# Patient Record
Sex: Male | Born: 1990 | Race: Black or African American | Hispanic: No | Marital: Single | Smoking: Current some day smoker
Health system: Southern US, Community
[De-identification: ages and names within clinical notes are randomized; demographics above are authoritative.]

## PROBLEM LIST (undated history)

## (undated) DIAGNOSIS — J45909 Unspecified asthma, uncomplicated: Secondary | ICD-10-CM

---

## 2014-09-20 ENCOUNTER — Encounter (HOSPITAL_COMMUNITY): Payer: Self-pay | Admitting: Emergency Medicine

## 2014-09-20 ENCOUNTER — Emergency Department (HOSPITAL_COMMUNITY)
Admission: EM | Admit: 2014-09-20 | Discharge: 2014-09-20 | Disposition: A | Discharge status: Home / Self Care | Payer: Self-pay | Attending: Emergency Medicine | Admitting: Emergency Medicine

## 2014-09-20 DIAGNOSIS — R0789 Other chest pain: Secondary | ICD-10-CM | POA: Insufficient documentation

## 2014-09-20 DIAGNOSIS — Z720 Tobacco use: Secondary | ICD-10-CM | POA: Insufficient documentation

## 2014-09-20 DIAGNOSIS — Z7952 Long term (current) use of systemic steroids: Secondary | ICD-10-CM | POA: Insufficient documentation

## 2014-09-20 DIAGNOSIS — J45901 Unspecified asthma with (acute) exacerbation: Secondary | ICD-10-CM | POA: Insufficient documentation

## 2014-09-20 DIAGNOSIS — Z79899 Other long term (current) drug therapy: Secondary | ICD-10-CM | POA: Insufficient documentation

## 2014-09-20 HISTORY — DX: Unspecified asthma, uncomplicated: J45.909

## 2014-09-20 MED ORDER — ALBUTEROL SULFATE (2.5 MG/3ML) 0.083% IN NEBU
5.0000 mg | INHALATION_SOLUTION | Freq: Once | RESPIRATORY_TRACT | Status: AC
  Administered 2014-09-20: 5 mg via RESPIRATORY_TRACT
  Filled 2014-09-20: qty 6

## 2014-09-20 MED ORDER — PREDNISONE 20 MG PO TABS: 40.0000 mg | ORAL_TABLET | Freq: Every day | ORAL | Status: DC

## 2014-09-20 MED ORDER — ALBUTEROL SULFATE HFA 108 (90 BASE) MCG/ACT IN AERS
2.0000 | INHALATION_SPRAY | Freq: Once | RESPIRATORY_TRACT | Status: AC
  Administered 2014-09-20: 2 via RESPIRATORY_TRACT
  Filled 2014-09-20: qty 6.7

## 2014-09-20 MED ORDER — PREDNISONE 20 MG PO TABS
60.0000 mg | ORAL_TABLET | Freq: Once | ORAL | Status: AC
  Administered 2014-09-20: 60 mg via ORAL
  Filled 2014-09-20: qty 3

## 2014-09-20 NOTE — ED Notes (Addendum)
Patient O2 sats remain 100% on room air while ambulating

## 2014-09-20 NOTE — ED Notes (Signed)
Pt. reports asthma attack with dry cough onsett his morning , denies fever or chills.

## 2014-09-20 NOTE — Discharge Instructions (Signed)

## 2014-09-20 NOTE — ED Provider Notes (Signed)
CSN: [id number]     Arrival date & time 09/20/14  [id number] History   First MD Initiated Contact with Patient 09/20/14 [phone]     Chief Complaint  Patient presents with  . Asthma     (Consider location/radiation/quality/duration/timing/severity/associated sxs/prior Treatment) HPI Comments: 23 year old male with a history of asthma presents to the emergency department for further evaluation of shortness of breath. He states that he was joking with a friend and started to laugh uncontrollably which triggered an asthma attack. He states that he has not had problems with his asthma for the last 2-3 months. He states he is out of his albuterol inhaler and was unable to take any medication for symptoms prior to arrival. He states that his symptoms have improved since receiving a nebulizer treatment in the ED. He states that he experienced some associated wheezing and chest tightness. No associated fever, cough, vomiting, or syncope.  Patient is a 23 y.o. male presenting with asthma. The history is provided by the patient. No language interpreter was used.  Asthma Pertinent negatives include no coughing, fever or vomiting.    Past Medical History  Diagnosis Date  . Asthma    History reviewed. No pertinent past surgical history. No family history on file. History  Substance Use Topics  . Smoking status: Current Every Day Smoker  . Smokeless tobacco: Not on file  . Alcohol Use: No    Review of Systems  Constitutional: Negative for fever.  Respiratory: Positive for chest tightness, shortness of breath and wheezing. Negative for cough.   Gastrointestinal: Negative for vomiting.  Neurological: Negative for syncope.  All other systems reviewed and are negative.   Allergies  Review of patient's allergies indicates no known allergies.  Home Medications   Prior to Admission medications   Medication Sig Start Date End Date Taking? Authorizing Provider  albuterol (PROVENTIL HFA;VENTOLIN HFA) 108  (90 BASE) MCG/ACT inhaler Inhale 2 puffs into the lungs every 6 (six) hours as needed for wheezing or shortness of breath.   Yes Historical Provider, MD  predniSONE (DELTASONE) 20 MG tablet Take 2 tablets (40 mg total) by mouth daily. Begin on 09/21/14 09/20/14   Antony MaduraKelly Mistie Adney, PA-C   BP 116/67 mmHg  Pulse 61  Temp(Src) 97.6 F (36.4 C) (Oral)  Resp 16  Ht 6\' 1"  (1.854 m)  Wt 195 lb (88.451 kg)  BMI 25.73 kg/m2  SpO2 100%   Physical Exam  Constitutional: He is oriented to person, place, and time. He appears well-developed and well-nourished. No distress.  Nontoxic/nonseptic appearing  HENT:  Head: Normocephalic and atraumatic.  Eyes: Conjunctivae and EOM are normal. No scleral icterus.  Neck: Normal range of motion.  Cardiovascular: Normal rate, regular rhythm and normal heart sounds.   Pulmonary/Chest: Effort normal and breath sounds normal. No respiratory distress. He has no wheezes. He has no rales.  Respirations even and unlabored. No retractions or accessory muscle use  Musculoskeletal: Normal range of motion.  Neurological: He is alert and oriented to person, place, and time. He exhibits normal muscle tone. Coordination normal.  GCS 15. Patient speaking in full goal oriented sentences.  Skin: Skin is warm and dry. No rash noted. He is not diaphoretic. No erythema. No pallor.  Psychiatric: He has a normal mood and affect. His behavior is normal.  Nursing note and vitals reviewed.   ED Course  Procedures (including critical care time) Labs Review Labs Reviewed - No data to display  Imaging Review No results found.   EKG Interpretation  None      MDM   Final diagnoses:  Asthma exacerbation    Patient ambulated in ED with O2 saturations at 100%, no current signs of respiratory distress. Lungs CTAB on exam, after patient received DuoNob. Prednisone given in the ED and pt will be discharged with 5 day burst. Pt states they are breathing at baseline. Pt has been instructed  to continue using prescribed medications and to speak with PCP about today's exacerbation. Return precautions provided and patient agreeable to plan with no unaddressed concerns.   Filed Vitals:   09/20/14 0500 09/20/14 0512 09/20/14 0515 09/20/14 0530  BP: 123/71 123/71 122/68 116/67  Pulse: 64 68 62 61  Temp:      TempSrc:      Resp:  16    Height:      Weight:      SpO2: 100% 100% 100% 100%        Antony MaduraKelly Omer Puccinelli, PA-C 09/20/14 0556  Dione Boozeavid Glick, MD 09/20/14 [phone]

## 2015-04-04 ENCOUNTER — Encounter (HOSPITAL_COMMUNITY): Payer: Self-pay

## 2015-04-04 ENCOUNTER — Emergency Department (HOSPITAL_COMMUNITY)
Admission: EM | Admit: 2015-04-04 | Discharge: 2015-04-04 | Disposition: A | Discharge status: Home / Self Care | Payer: Self-pay | Attending: Emergency Medicine | Admitting: Emergency Medicine

## 2015-04-04 DIAGNOSIS — Y9389 Activity, other specified: Secondary | ICD-10-CM | POA: Insufficient documentation

## 2015-04-04 DIAGNOSIS — Z79899 Other long term (current) drug therapy: Secondary | ICD-10-CM | POA: Insufficient documentation

## 2015-04-04 DIAGNOSIS — S80862A Insect bite (nonvenomous), left lower leg, initial encounter: Secondary | ICD-10-CM | POA: Insufficient documentation

## 2015-04-04 DIAGNOSIS — J45909 Unspecified asthma, uncomplicated: Secondary | ICD-10-CM | POA: Insufficient documentation

## 2015-04-04 DIAGNOSIS — Z720 Tobacco use: Secondary | ICD-10-CM | POA: Insufficient documentation

## 2015-04-04 DIAGNOSIS — Y998 Other external cause status: Secondary | ICD-10-CM | POA: Insufficient documentation

## 2015-04-04 DIAGNOSIS — W57XXXA Bitten or stung by nonvenomous insect and other nonvenomous arthropods, initial encounter: Secondary | ICD-10-CM | POA: Insufficient documentation

## 2015-04-04 DIAGNOSIS — Y9289 Other specified places as the place of occurrence of the external cause: Secondary | ICD-10-CM | POA: Insufficient documentation

## 2015-04-04 DIAGNOSIS — L259 Unspecified contact dermatitis, unspecified cause: Secondary | ICD-10-CM | POA: Insufficient documentation

## 2015-04-04 DIAGNOSIS — Z7952 Long term (current) use of systemic steroids: Secondary | ICD-10-CM | POA: Insufficient documentation

## 2015-04-04 MED ORDER — DOXYCYCLINE HYCLATE 100 MG PO CAPS: 100.0000 mg | ORAL_CAPSULE | Freq: Two times a day (BID) | ORAL | Status: DC

## 2015-04-04 NOTE — Discharge Instructions (Signed)
Contact Dermatitis °Contact dermatitis is a reaction to certain substances that touch the skin. Contact dermatitis can be either irritant contact dermatitis or allergic contact dermatitis. Irritant contact dermatitis does not require previous exposure to the substance for a reaction to occur. Allergic contact dermatitis only occurs if you have been exposed to the substance before. Upon a repeat exposure, your body reacts to the substance.  °CAUSES  °Many substances can cause contact dermatitis. Irritant dermatitis is most commonly caused by repeated exposure to mildly irritating substances, such as: °· Makeup. °· Soaps. °· Detergents. °· Bleaches. °· Acids. °· Metal salts, such as nickel. °Allergic contact dermatitis is most commonly caused by exposure to: °· Poisonous plants. °· Chemicals (deodorants, shampoos). °· Jewelry. °· Latex. °· Neomycin in triple antibiotic cream. °· Preservatives in products, including clothing. °SYMPTOMS  °The area of skin that is exposed may develop: °1. Dryness or flaking. °2. Redness. °3. Cracks. °4. Itching. °5. Pain or a burning sensation. °6. Blisters. °With allergic contact dermatitis, there may also be swelling in areas such as the eyelids, mouth, or genitals.  °DIAGNOSIS  °Your caregiver can usually tell what the problem is by doing a physical exam. In cases where the cause is uncertain and an allergic contact dermatitis is suspected, a patch skin test may be performed to help determine the cause of your dermatitis. °TREATMENT °Treatment includes protecting the skin from further contact with the irritating substance by avoiding that substance if possible. Barrier creams, powders, and gloves may be helpful. Your caregiver may also recommend: °· Steroid creams or ointments applied 2 times daily. For best results, soak the rash area in cool water for 20 minutes. Then apply the medicine. Cover the area with a plastic wrap. You can store the steroid cream in the refrigerator for a  "chilly" effect on your rash. That may decrease itching. Oral steroid medicines may be needed in more severe cases. °· Antibiotics or antibacterial ointments if a skin infection is present. °· Antihistamine lotion or an antihistamine taken by mouth to ease itching. °· Lubricants to keep moisture in your skin. °· Burow's solution to reduce redness and soreness or to dry a weeping rash. Mix one packet or tablet of solution in 2 cups cool water. Dip a clean washcloth in the mixture, wring it out a bit, and put it on the affected area. Leave the cloth in place for 30 minutes. Do this as often as possible throughout the day. °· Taking several cornstarch or baking soda baths daily if the area is too large to cover with a washcloth. °Harsh chemicals, such as alkalis or acids, can cause skin damage that is like a burn. You should flush your skin for 15 to 20 minutes with cold water after such an exposure. You should also seek immediate medical care after exposure. Bandages (dressings), antibiotics, and pain medicine may be needed for severely irritated skin.  °HOME CARE INSTRUCTIONS °· Avoid the substance that caused your reaction. °· Keep the area of skin that is affected away from hot water, soap, sunlight, chemicals, acidic substances, or anything else that would irritate your skin. °· Do not scratch the rash. Scratching may cause the rash to become infected. °· You may take cool baths to help stop the itching. °· Only take over-the-counter or prescription medicines as directed by your caregiver. °· See your caregiver for follow-up care as directed to make sure your skin is healing properly. °SEEK MEDICAL CARE IF:  °· Your condition is not better after 3   days of treatment. °· You seem to be getting worse. °· You see signs of infection such as swelling, tenderness, redness, soreness, or warmth in the affected area. °· You have any problems related to your medicines. °Document Released: 10/04/2000 Document Revised:  12/30/2011 Document Reviewed: 03/12/2011 °Exit[employer]® Patient Information ©2015 [employer]. This information is not intended to replace advice given to you by your health care provider. Make sure you discuss any questions you have with your health care provider. ° °Tick Bite Information °Ticks are insects that attach themselves to the skin and draw blood for food. There are various types of ticks. Common types include wood ticks and deer ticks. Most ticks live in shrubs and grassy areas. Ticks can climb onto your body when you make contact with leaves or grass where the tick is waiting. The most common places on the body for ticks to attach themselves are the scalp, neck, armpits, waist, and groin. °Most tick bites are harmless, but sometimes ticks carry germs that cause diseases. These germs can be spread to a person during the tick's feeding process. The chance of a disease spreading through a tick bite depends on:  °· The type of tick. °· Time of year.   °· How long the tick is attached.   °· Geographic location.   °HOW CAN YOU PREVENT TICK BITES? °Take these steps to help prevent tick bites when you are outdoors: °· Wear protective clothing. Long sleeves and long pants are best.   °· Wear white clothes so you can see ticks more easily. °· Tuck your pant legs into your socks.   °· If walking on a trail, stay in the middle of the trail to avoid brushing against bushes. °· Avoid walking through areas with long grass.  °· Put insect repellent on all exposed skin and along boot tops, pant legs, and sleeve cuffs.   °· Check clothing, hair, and skin repeatedly and before going inside.   °· Brush off any ticks that are not attached. °· Take a shower or bath as soon as possible after being outdoors.    °WHAT IS THE PROPER WAY TO REMOVE A TICK? °Ticks should be removed as soon as possible to help prevent diseases caused by tick bites. °7. If latex gloves are available, put them on before trying to remove a tick.    °8. Using fine-point tweezers, grasp the tick as close to the skin as possible. You may also use curved forceps or a tick removal tool. Grasp the tick as close to its head as possible. Avoid grasping the tick on its body. °9. Pull gently with steady upward pressure until the tick lets go. Do not twist the tick or jerk it suddenly. This may break off the tick's head or mouth parts. °10. Do not squeeze or crush the tick's body. This could force disease-carrying fluids from the tick into your body.   °11. After the tick is removed, wash the bite area and your hands with soap and water or other disinfectant such as alcohol. °12. Apply a small amount of antiseptic cream or ointment to the bite site.   °13. Wash and disinfect any instruments that were used.   °Do not try to remove a tick by applying a hot match, petroleum jelly, or fingernail polish to the tick. These methods do not work and may increase the chances of disease being spread from the tick bite.  °WHEN SHOULD YOU SEEK MEDICAL CARE? °Contact your health care provider if you are unable to remove a tick from your skin or if a part   of the tick breaks off and is stuck in the skin.  °After a tick bite, you need to be aware of signs and symptoms that could be related to diseases spread by ticks. Contact your health care provider if you develop any of the following in the days or weeks after the tick bite: °· Unexplained fever. °· Rash. A circular rash that appears days or weeks after the tick bite may indicate the possibility of Lyme disease. The rash may resemble a target with a bull's-eye and may occur at a different part of your body than the tick bite. °· Redness and swelling in the area of the tick bite.   °· Tender, swollen lymph glands.   °· Diarrhea.   °· Weight loss.   °· Cough.   °· Fatigue.   °· Muscle, joint, or bone pain.   °· Abdominal pain.   °· Headache.   °· Lethargy or a change in your level of consciousness. °· Difficulty walking or moving your  legs.   °· Numbness in the legs.   °· Paralysis. °· Shortness of breath.   °· Confusion.   °· Repeated vomiting.   °Document Released: 10/04/2000 Document Revised: 07/28/2013 Document Reviewed: 03/17/2013 °Exit[employer]® Patient Information ©2015 [employer]. This information is not intended to replace advice given to you by your health care provider. Make sure you discuss any questions you have with your health care provider. ° °

## 2015-04-04 NOTE — ED Notes (Signed)
Pt reports tick bite 2 weeks ago on anterior lower leg, onset 5-6 days ago rash and swelling noted to that area.  Pt reports fever several days ago.  No other s/s noted.

## 2015-04-04 NOTE — ED Provider Notes (Signed)
CSN: [id number]     Arrival date & time 04/04/15  1200 History  This chart was scribed for non-physician practitioner, Teressa Lower, NP, working with Mancel Bale, MD, by Ronney Lion, ED Scribe. This patient was seen in room TR09C/TR09C and the patient's care was started at 12:22 PM.      Chief Complaint  Patient presents with  . Rash   The history is provided by the patient. No language interpreter was used.    HPI Comments: Daniel Keith is a 24 y.o. male who presents to the Emergency Department complaining of an area of gradual onset, constant, gradually worsening redness and swelling on his anterior left lower leg that began 5 days ago after being bitten by a tick bite in the same area 2 weeks ago. He states it itches "sometimes." Patient works outdoors.   Past Medical History  Diagnosis Date  . Asthma    History reviewed. No pertinent past surgical history. History reviewed. No pertinent family history. History  Substance Use Topics  . Smoking status: Current Some Day Smoker    Types: Cigarettes  . Smokeless tobacco: Not on file  . Alcohol Use: No    Review of Systems  Skin: Positive for rash.  All other systems reviewed and are negative.   Allergies  Review of patient's allergies indicates no known allergies.  Home Medications   Prior to Admission medications   Medication Sig Start Date End Date Taking? Authorizing Provider  albuterol (PROVENTIL HFA;VENTOLIN HFA) 108 (90 BASE) MCG/ACT inhaler Inhale 2 puffs into the lungs every 6 (six) hours as needed for wheezing or shortness of breath.    Historical Provider, MD  predniSONE (DELTASONE) 20 MG tablet Take 2 tablets (40 mg total) by mouth daily. Begin on 09/21/14 09/20/14   Antony Madura, PA-C   BP 126/68 mmHg  Pulse 79  Temp(Src) 98 F (36.7 C) (Oral)  Resp 20  Ht 6\' 1"  (1.854 m)  Wt 177 lb 12.8 oz (80.65 kg)  BMI 23.46 kg/m2  SpO2 98% Physical Exam  Constitutional: He is oriented to person, place, and  time. He appears well-developed and well-nourished. No distress.  HENT:  Head: Normocephalic and atraumatic.  Eyes: Conjunctivae and EOM are normal.  Neck: Neck supple. No tracheal deviation present.  Cardiovascular: Normal rate.   Pulmonary/Chest: Effort normal. No respiratory distress.  Musculoskeletal: Normal range of motion.  Neurological: He is alert and oriented to person, place, and time.  Skin:  Red vesicular rash to the left lower leg. No drainage or warmth noted  Psychiatric: He has a normal mood and affect. His behavior is normal.  Nursing note and vitals reviewed.   ED Course  Procedures (including critical care time)  DIAGNOSTIC STUDIES: Oxygen Saturation is 98% on RA, normal by my interpretation.    COORDINATION OF CARE: 12:23 PM - After examination, suspect contact dermatitis or exposure to poison ivy. However, will cover prophylactic treatment for possible exposure to tickborne diseases (doxycycline) and will Rx topical steroid ointment. Discussed this with pt, who verbalized understanding and agreed to plan.  MDM   Final diagnoses:  Tick bite  Contact dermatitis   Rash more consistent with contact dermatitis discussed use of steroid cream with pt. Will treat prophylactically with doxy based on recommendations  I personally performed the services described in this documentation, which was scribed in my presence. The recorded information has been reviewed and is accurate.     Teressa Lower, NP 04/04/15 1257  Mancel Bale, MD 04/05/15  [id number] 

## 2015-09-06 ENCOUNTER — Emergency Department (HOSPITAL_COMMUNITY)
Admission: EM | Admit: 2015-09-06 | Discharge: 2015-09-06 | Disposition: A | Discharge status: Home / Self Care | Payer: Self-pay | Attending: Emergency Medicine | Admitting: Emergency Medicine

## 2015-09-06 ENCOUNTER — Encounter (HOSPITAL_COMMUNITY): Payer: Self-pay | Admitting: Emergency Medicine

## 2015-09-06 ENCOUNTER — Emergency Department (HOSPITAL_COMMUNITY): Payer: Self-pay

## 2015-09-06 DIAGNOSIS — H9319 Tinnitus, unspecified ear: Secondary | ICD-10-CM | POA: Insufficient documentation

## 2015-09-06 DIAGNOSIS — H6691 Otitis media, unspecified, right ear: Secondary | ICD-10-CM | POA: Insufficient documentation

## 2015-09-06 DIAGNOSIS — F17210 Nicotine dependence, cigarettes, uncomplicated: Secondary | ICD-10-CM | POA: Insufficient documentation

## 2015-09-06 DIAGNOSIS — K219 Gastro-esophageal reflux disease without esophagitis: Secondary | ICD-10-CM | POA: Insufficient documentation

## 2015-09-06 DIAGNOSIS — R42 Dizziness and giddiness: Secondary | ICD-10-CM | POA: Insufficient documentation

## 2015-09-06 DIAGNOSIS — H53149 Visual discomfort, unspecified: Secondary | ICD-10-CM | POA: Insufficient documentation

## 2015-09-06 DIAGNOSIS — J4521 Mild intermittent asthma with (acute) exacerbation: Secondary | ICD-10-CM | POA: Insufficient documentation

## 2015-09-06 DIAGNOSIS — R519 Headache, unspecified: Secondary | ICD-10-CM

## 2015-09-06 DIAGNOSIS — R51 Headache: Secondary | ICD-10-CM

## 2015-09-06 DIAGNOSIS — Z79899 Other long term (current) drug therapy: Secondary | ICD-10-CM | POA: Insufficient documentation

## 2015-09-06 MED ORDER — ALBUTEROL SULFATE HFA 108 (90 BASE) MCG/ACT IN AERS
2.0000 | INHALATION_SPRAY | Freq: Once | RESPIRATORY_TRACT | Status: AC
  Administered 2015-09-06: 2 via RESPIRATORY_TRACT
  Filled 2015-09-06: qty 6.7

## 2015-09-06 MED ORDER — OMEPRAZOLE 20 MG PO CPDR: 20.0000 mg | DELAYED_RELEASE_CAPSULE | Freq: Every day | ORAL | Status: DC

## 2015-09-06 MED ORDER — PREDNISONE 20 MG PO TABS: 40.0000 mg | ORAL_TABLET | Freq: Every day | ORAL | Status: DC

## 2015-09-06 MED ORDER — AMOXICILLIN 500 MG PO CAPS
1000.0000 mg | ORAL_CAPSULE | Freq: Once | ORAL | Status: AC
  Administered 2015-09-06: 1000 mg via ORAL
  Filled 2015-09-06: qty 2

## 2015-09-06 MED ORDER — ACETAMINOPHEN 500 MG PO TABS
1000.0000 mg | ORAL_TABLET | Freq: Once | ORAL | Status: AC
  Administered 2015-09-06: 1000 mg via ORAL
  Filled 2015-09-06: qty 2

## 2015-09-06 MED ORDER — METOCLOPRAMIDE HCL 10 MG PO TABS
10.0000 mg | ORAL_TABLET | Freq: Once | ORAL | Status: AC
  Administered 2015-09-06: 10 mg via ORAL
  Filled 2015-09-06: qty 1

## 2015-09-06 MED ORDER — MECLIZINE HCL 12.5 MG PO TABS
12.5000 mg | ORAL_TABLET | Freq: Three times a day (TID) | ORAL | Status: DC | PRN
Start: 2015-09-06 — End: 2020-08-26

## 2015-09-06 MED ORDER — ALBUTEROL SULFATE (2.5 MG/3ML) 0.083% IN NEBU
5.0000 mg | INHALATION_SOLUTION | Freq: Once | RESPIRATORY_TRACT | Status: AC
  Administered 2015-09-06: 5 mg via RESPIRATORY_TRACT
  Filled 2015-09-06: qty 6

## 2015-09-06 MED ORDER — DIPHENHYDRAMINE HCL 25 MG PO CAPS
25.0000 mg | ORAL_CAPSULE | Freq: Once | ORAL | Status: AC
  Administered 2015-09-06: 25 mg via ORAL
  Filled 2015-09-06: qty 1

## 2015-09-06 MED ORDER — AMOXICILLIN 500 MG PO CAPS: 500.0000 mg | ORAL_CAPSULE | Freq: Once | ORAL | Status: DC

## 2015-09-06 MED ORDER — AMOXICILLIN 500 MG PO CAPS: 500.0000 mg | ORAL_CAPSULE | Freq: Three times a day (TID) | ORAL | Status: DC

## 2015-09-06 MED ORDER — IPRATROPIUM-ALBUTEROL 0.5-2.5 (3) MG/3ML IN SOLN
3.0000 mL | Freq: Once | RESPIRATORY_TRACT | Status: AC
  Administered 2015-09-06: 3 mL via RESPIRATORY_TRACT
  Filled 2015-09-06: qty 3

## 2015-09-06 NOTE — ED Notes (Signed)
Patient transported to X-ray 

## 2015-09-06 NOTE — ED Provider Notes (Signed)
CSN: [id number]     Arrival date & time 09/06/15  1930 History  By signing my name below, I, Daniel Keith, attest that this documentation has been prepared under the direction and in the presence of Daniel Berry, PA-C. Electronically Signed: Evon Keith, ED Scribe. 09/07/2015. 2:21 AM.      Chief Complaint  Patient presents with  . Asthma   The history is provided by the patient. No language interpreter was used.   HPI Comments: Daniel Keith is a 24 y.o. male who presents to the Emergency Department complaining of SOB onset 3 days prior. Pt states that for the past 3 nights he wakes up with SOB and wheezing. Pt also reports slight chest tightness. Pt states that he has been spitting up green sputum. Pt states that he does not currently have a rescue inhaler. Pt states that his symptoms are worse when taking in deep breaths,  Pt also reports intermittent temporal HA and dizziness for 1 month. Pt states that the HA usually last for about 1 hour. Pt reports associated photophobia and tinnitus. Pt describes the dizziness as the room as spinning. Pt states that the dizziness is worse when standing and walking around.  Denies leg swelling, fever, rhinorrhea, cough or congestion. Pt reports that he is a .5 pack per day smoker with 4 packyear hx. Pt denies any recent sick contacts. Pt denies neck pain, neck stiffness, confusion or LOC. Pt denies abdominal pain, n/v/d. He denies chest pain, orthopnea, lower extremity edema, palpitations, lightheadedness.    Past Medical History  Diagnosis Date  . Asthma    History reviewed. No pertinent past surgical history. No family history on file. Social History  Substance Use Topics  . Smoking status: Current Some Day Smoker    Types: Cigarettes  . Smokeless tobacco: None  . Alcohol Use: No    Review of Systems  Constitutional: Negative for fever.  HENT: Positive for tinnitus. Negative for congestion, ear discharge, rhinorrhea and sneezing.    Eyes: Positive for photophobia.  Respiratory: Positive for chest tightness, shortness of breath and wheezing. Negative for cough.   Cardiovascular: Negative for leg swelling.  Gastrointestinal: Negative for nausea, vomiting, abdominal pain and diarrhea.  Musculoskeletal: Negative for neck pain and neck stiffness.  Neurological: Positive for dizziness and headaches. Negative for syncope.  Psychiatric/Behavioral: Negative for confusion.      Allergies  Review of patient's allergies indicates no known allergies.  Home Medications   Prior to Admission medications   Medication Sig Start Date End Date Taking? Authorizing Provider  albuterol (PROVENTIL HFA;VENTOLIN HFA) 108 (90 BASE) MCG/ACT inhaler Inhale 2 puffs into the lungs every 6 (six) hours as needed for wheezing or shortness of breath.   Yes Historical Provider, MD  amoxicillin (AMOXIL) 500 MG capsule Take 1 capsule (500 mg total) by mouth 3 (three) times daily. 09/06/15   Daniel Berry, PA-C  doxycycline (VIBRAMYCIN) 100 MG capsule Take 1 capsule (100 mg total) by mouth 2 (two) times daily. Patient not taking: Reported on 09/06/2015 04/04/15   Teressa Lower, NP  meclizine (ANTIVERT) 12.5 MG tablet Take 1 tablet (12.5 mg total) by mouth 3 (three) times daily as needed for dizziness. 09/06/15   Daniel Berry, PA-C  omeprazole (PRILOSEC) 20 MG capsule Take 1 capsule (20 mg total) by mouth daily. 09/06/15   Daniel Berry, PA-C  predniSONE (DELTASONE) 20 MG tablet Take 2 tablets (40 mg total) by mouth daily. 09/06/15   Daniel Berry, PA-C   BP 121/68 mmHg  Pulse 56  Temp(Src) 97.5 F (36.4 C) (Oral)  Resp 18  Ht 6\' 1"  (1.854 m)  Wt 186 lb 2 oz (84.426 kg)  BMI 24.56 kg/m2  SpO2 100%   Physical Exam  Constitutional: He is oriented to person, place, and time. He appears well-developed and well-nourished. No distress.  HENT:  Head: Normocephalic and atraumatic.  Right Ear: Tympanic membrane is erythematous and bulging.  Nose: Nose  normal.  Mouth/Throat: Oropharynx is clear and moist. No oropharyngeal exudate.  Right TM erythematous and bulging, dull with loss of landmarks and cone of light.  Left TM normal  Eyes: Conjunctivae and EOM are normal. Pupils are equal, round, and reactive to light. Right eye exhibits no discharge. Left eye exhibits no discharge. No scleral icterus.  Neck: Trachea normal, normal range of motion, full passive range of motion without pain and phonation normal. Neck supple. No JVD present. No tracheal tenderness, no spinous process tenderness and no muscular tenderness present. No rigidity. No tracheal deviation, no edema, no erythema and normal range of motion present. No Brudzinski's sign and no Kernig's sign noted. No thyromegaly present.  Cardiovascular: Normal rate, regular rhythm, normal heart sounds and intact distal pulses.  Exam reveals no gallop and no friction rub.   No murmur heard. Pulses:      Radial pulses are 2+ on the right side, and 2+ on the left side.       Dorsalis pedis pulses are 2+ on the right side, and 2+ on the left side.  Symmetrical pulses palpated, radial 2+, dorsal pedis 2+. No pretibial edema.  Pulmonary/Chest: Effort normal. No accessory muscle usage or stridor. No tachypnea. No respiratory distress. He has decreased breath sounds. He has no wheezes. He has rhonchi in the right middle field and the right lower field. He has no rales. He exhibits no tenderness and no retraction.  Decreased breath sounds throughout with rhonchi in the right middle and lower lung fields. No wheeze or rales.  Poor inspiratory effort. No tachypnea, accessory muscle use, or respiratory distress. No cyanosis or clubbing.  Abdominal: Soft. Normal appearance and bowel sounds are normal. He exhibits no distension and no mass. There is no tenderness. There is no rigidity, no rebound and no guarding.  Musculoskeletal: Normal range of motion. He exhibits no edema or tenderness.  Lymphadenopathy:     He has no cervical adenopathy.  Neurological: He is alert and oriented to person, place, and time. He has normal reflexes. No cranial nerve deficit. He exhibits normal muscle tone. Coordination normal.  Speech is clear and goal oriented, follows commands Major Cranial nerves without deficit, no facial droop Normal strength in upper and lower extremities bilaterally including dorsiflexion and plantar flexion, strong and equal grip strength Sensation normal to light and sharp touch Moves extremities without ataxia, coordination intact Normal finger to nose and rapid alternating movements Neg romberg, no pronator drift Normal gait and balance   Skin: Skin is warm and dry. No rash noted. He is not diaphoretic. No erythema. No pallor.  Psychiatric: He has a normal mood and affect. His behavior is normal. Judgment and thought content normal.  Nursing note and vitals reviewed.   ED Course  Procedures (including critical care time) DIAGNOSTIC STUDIES: Oxygen Saturation is 97% on RA, normal by my interpretation.    COORDINATION OF CARE: 2:21 AM-Discussed treatment plan with pt at bedside and pt agreed to plan.     Labs Review Labs Reviewed - No data to display  Imaging  Review Dg Chest 2 View  09/06/2015  CLINICAL DATA:  SOB x 3 days with right upper lobe chest pain. Hx of asthma and pneumonia EXAM: CHEST - 2 VIEW COMPARISON:  None available FINDINGS: Lungs are clear. Heart size and mediastinal contours are within normal limits. No effusion.  No pneumothorax. Visualized skeletal structures are unremarkable. IMPRESSION: No acute cardiopulmonary disease. Electronically Signed   By: Corlis Leak M.D.   On: 09/06/2015 22:32      EKG Interpretation None      MDM   Final diagnoses:  Asthma with bronchitis, mild intermittent, with acute exacerbation  Right otitis media, recurrence not specified, unspecified chronicity, unspecified otitis media type  Nonintractable headache, unspecified  chronicity pattern, unspecified headache type  Vertigo  Gastroesophageal reflux disease, esophagitis presence not specified   24 year old male with multiple complaints, initial complaint in triage was "asthma" Patient complains of cough with PND,. Current smoker, with asthma and pneumonia history.  He also complains of a moderate headache with photophobia, working in his ears and vertigo symptoms.  Physical exam for his lungs revealed decreased breath sounds throughout with rhonchi in the right side.  He complained of inspiratory chest pain but no tenderness to palpation of the chest wall.  He had 97% oxygen saturation on room air and had no respiratory distress. Breathing treatments were ordered as well as a chest x-ray, which was negative for PNA.  He stated he has been having increasing acid reflux lately, which may be exacerbating any asthma symptoms and cause a nocturnal cough to worsen. We discussed the treatment of both GERD and asthma.  He was treated with 2 breathing treatments, given a albuterol inhaler to take home.  He is given a dose of amoxicillin to treat the right acute otitis media.  A prescription of Prilosec was also given to the patient for treatment of GERD.  His headache was described as intermittent, located in his temples, with a throbbing quality. His vertigo was also intermittent and not reproducible with positional changes.  Exam revealed effusion, bulging and erythema of his right TM.  He had no focal neurological deficits, making central etiologies of vertigo less likely, and peripheral more likely, especially with recent URI sx and the appearance of his right ear, labyrinthitis may be a possible diagnosis.  Patient was given a trial of Antivert to use as needed for vertigo symptoms.  Patient is discharged home in satisfactory condition with stable vitals.  I personally performed the services described in this documentation, which was scribed in my presence. The recorded  information has been reviewed and is accurate.        Daniel Berry, PA-C 09/07/15 0222  Melene Plan, DO 09/07/15 [phone]

## 2015-09-06 NOTE — Discharge Instructions (Signed)
Asthma, Acute Bronchospasm °Acute bronchospasm caused by asthma is also referred to as an asthma attack. Bronchospasm means your air passages become narrowed. The narrowing is caused by inflammation and tightening of the muscles in the air tubes (bronchi) in your lungs. This can make it hard to breathe or cause you to wheeze and cough. °CAUSES °Possible triggers are: °· Animal dander from the skin, hair, or feathers of animals. °· Dust mites contained in house dust. °· Cockroaches. °· Pollen from trees or grass. °· Mold. °· Cigarette or tobacco smoke. °· Air pollutants such as dust, household cleaners, hair sprays, aerosol sprays, paint fumes, strong chemicals, or strong odors. °· Cold air or weather changes. Cold air may trigger inflammation. Winds increase molds and pollens in the air. °· Strong emotions such as crying or laughing hard. °· Stress. °· Certain medicines such as aspirin or beta-blockers. °· Sulfites in foods and drinks, such as dried fruits and wine. °· Infections or inflammatory conditions, such as a flu, cold, or inflammation of the nasal membranes (rhinitis). °· Gastroesophageal reflux disease (GERD). GERD is a condition where stomach acid backs up into your esophagus. °· Exercise or strenuous activity. °SIGNS AND SYMPTOMS  °· Wheezing. °· Excessive coughing, particularly at night. °· Chest tightness. °· Shortness of breath. °DIAGNOSIS  °Your health care provider will ask you about your medical history and perform a physical exam. A chest X-ray or blood testing may be performed to look for other causes of your symptoms or other conditions that may have triggered your asthma attack.  °TREATMENT  °Treatment is aimed at reducing inflammation and opening up the airways in your lungs.  Most asthma attacks are treated with inhaled medicines. These include quick relief or rescue medicines (such as bronchodilators) and controller medicines (such as inhaled corticosteroids). These medicines are sometimes  given through an inhaler or a nebulizer. Systemic steroid medicine taken by mouth or given through an IV tube also can be used to reduce the inflammation when an attack is moderate or severe. Antibiotic medicines are only used if a bacterial infection is present.  °HOME CARE INSTRUCTIONS  °· Rest. °· Drink plenty of liquids. This helps the mucus to remain thin and be easily coughed up. Only use caffeine in moderation and do not use alcohol until you have recovered from your illness. °· Do not smoke. Avoid being exposed to secondhand smoke. °· You play a critical role in keeping yourself in good health. Avoid exposure to things that cause you to wheeze or to have breathing problems. °· Keep your medicines up-to-date and available. Carefully follow your health care provider's treatment plan. °· Take your medicine exactly as prescribed. °· When pollen or pollution is bad, keep windows closed and use an air conditioner or go to places with air conditioning. °· Asthma requires careful medical care. See your health care provider for a follow-up as advised. If you are more than [redacted] weeks pregnant and you were prescribed any new medicines, let your obstetrician know about the visit and how you are doing. Follow up with your health care provider as directed. °· After you have recovered from your asthma attack, make an appointment with your outpatient doctor to talk about ways to reduce the likelihood of future attacks. If you do not have a doctor who manages your asthma, make an appointment with a primary care doctor to discuss your asthma. °SEEK IMMEDIATE MEDICAL CARE IF:  °· You are getting worse. °· You have trouble breathing. If severe, call your local   emergency services (911 in the [place]  You develop chest pain or discomfort.  You are vomiting.  You are not able to keep fluids down.  You are coughing up yellow, green, brown, or bloody sputum.  You have a fever and your symptoms suddenly get worse.  You have  trouble swallowing. MAKE SURE YOU:   Understand these instructions.  Will watch your condition.  Will get help right away if you are not doing well or get worse.   This information is not intended to replace advice given to you by your health care provider. Make sure you discuss any questions you have with your health care provider.   Document Released: 01/22/2007 Document Revised: 10/12/2013 Document Reviewed: 04/14/2013 Elsevier Interactive Patient Education 2016 Else[employer] Inc[employer]  Dizziness Dizziness is a common problem. It is a feeling of unsteadiness or light-headedness. You may feel like you are about to faint. Dizziness can lead to injury if you stumble or fall. Anyone can become dizzy, but dizziness is more common in older adults. This condition can be caused by a number of things, including medicines, dehydration, or illness. HOME CARE INSTRUCTIONS Taking these steps may help with your condition: Eating and Drinking  Drink enough fluid to keep your urine clear or pale yellow. This helps to keep you from becoming dehydrated. Try to drink more clear fluids, such as water.  Do not drink alcohol.  Limit your caffeine intake if directed by your health care provider.  Limit your salt intake if directed by your health care provider. Activity  Avoid making quick movements.  Rise slowly from chairs and steady yourself until you feel okay.  In the morning, first sit up on the side of the bed. When you feel okay, stand slowly while you hold onto something until you know that your balance is fine.  Move your legs often if you need to stand in one place for a long time. Tighten and relax your muscles in your legs while you are standing.  Do not drive or operate heavy machinery if you feel dizzy.  Avoid bending down if you feel dizzy. Place items in your home so that they are easy for you to reach without leaning over. Lifestyle  Do not use any tobacco products, including  cigarettes, chewing tobacco, or electronic cigarettes. If you need help quitting, ask your health care provider.  Try to reduce your stress level, such as with yoga or meditation. Talk with your health care provider if you need help. General Instructions  Watch your dizziness for any changes.  Take medicines only as directed by your health care provider. Talk with your health care provider if you think that your dizziness is caused by a medicine that you are taking.  Tell a friend or a family member that you are feeling dizzy. If he or she notices any changes in your behavior, have this person call your health care provider.  Keep all follow-up visits as directed by your health care provider. This is important. SEEK MEDICAL CARE IF:  Your dizziness does not go away.  Your dizziness or light-headedness gets worse.  You feel nauseous.  You have reduced hearing.  You have new symptoms.  You are unsteady on your feet or you feel like the room is spinning. SEEK IMMEDIATE MEDICAL CARE IF:  You vomit or have diarrhea and are unable to eat or drink anything.  You have problems talking, walking, swallowing, or using your arms, hands, or legs.  You feel generally  weak.  You are not thinking clearly or you have trouble forming sentences. It may take a friend or family member to notice this.  You have chest pain, abdominal pain, shortness of breath, or sweating.  Your vision changes.  You notice any bleeding.  You have a headache.  You have neck pain or a stiff neck.  You have a fever.   This information is not intended to replace advice given to you by your health care provider. Make sure you discuss any questions you have with your health care provider.   Document Released: 04/02/2001 Document Revised: 02/21/2015 Document Reviewed: 10/03/2014 Elsevier Interactive Patient Education 2016 Else[employer] Inc.  General Headache Without Cause A headache is pain or discomfort felt  around the head or neck area. There are many causes and types of headaches. In some cases, the cause may not be found.  HOME CARE  Managing Pain  Take over-the-counter and prescription medicines only as told by your doctor.  Lie down in a dark, quiet room when you have a headache.  If directed, apply ice to the head and neck area:  Put ice in a plastic bag.  Place a towel between your skin and the bag.  Leave the ice on for 20 minutes, 2-3 times per day.  Use a heating pad or hot shower to apply heat to the head and neck area as told by your doctor.  Keep lights dim if bright lights bother you or make your headaches worse. Eating and Drinking  Eat meals on a regular schedule.  Lessen how much alcohol you drink.  Lessen how much caffeine you drink, or stop drinking caffeine. General Instructions  Keep all follow-up visits as told by your doctor. This is important.  Keep a journal to find out if certain things bring on headaches. For example, write down:  What you eat and drink.  How much sleep you get.  Any change to your diet or medicines.  Relax by getting a massage or doing other relaxing activities.  Lessen stress.  Sit up straight. Do not tighten (tense) your muscles.  Do not use tobacco products. This includes cigarettes, chewing tobacco, or e-cigarettes. If you need help quitting, ask your doctor.  Exercise regularly as told by your doctor.  Get enough sleep. This often means 7-9 hours of sleep. GET HELP IF:  Your symptoms are not helped by medicine.  You have a headache that feels different than the other headaches.  You feel sick to your stomach (nauseous) or you throw up (vomit).  You have a fever. GET HELP RIGHT AWAY IF:   Your headache becomes really bad.  You keep throwing up.  You have a stiff neck.  You have trouble seeing.  You have trouble speaking.  You have pain in the eye or ear.  Your muscles are weak or you lose muscle  control.  You lose your balance or have trouble walking.  You feel like you will pass out (faint) or you pass out.  You have confusion.   This information is not intended to replace advice given to you by your health care provider. Make sure you discuss any questions you have with your health care provider.   Document Released: 07/16/2008 Document Revised: 06/28/2015 Document Reviewed: 01/30/2015 Elsevier Interactive Patient Education 2016 Elsevier Inc.  Heartburn Heartburn is a type of pain or discomfort that can happen in the throat or chest. It is often described as a burning pain. It may also cause a  bad taste in the mouth. Heartburn may feel worse when you lie down or bend over. It may be caused by stomach contents that move back up (reflux) into the tube that connects the mouth with the stomach (esophagus). HOME CARE Take these actions to lessen your discomfort and to help avoid problems. Diet  Follow a diet as told by your doctor. You may need to avoid foods and drinks such as:  Coffee and tea (with or without caffeine).  Drinks that contain alcohol.  Energy drinks and sports drinks.  Carbonated drinks or sodas.  Chocolate and cocoa.  Peppermint and mint flavorings.  Garlic and onions.  Horseradish.  Spicy and acidic foods, such as peppers, chili powder, curry powder, vinegar, hot sauces, and BBQ sauce.  Citrus fruit juices and citrus fruits, such as oranges, lemons, and limes.  Tomato-based foods, such as red sauce, chili, salsa, and pizza with red sauce.  Fried and fatty foods, such as donuts, french fries, potato chips, and high-fat dressings.  High-fat meats, such as hot dogs, rib eye steak, sausage, ham, and bacon.  High-fat dairy items, such as whole milk, butter, and cream cheese.  Eat small meals often. Avoid eating large meals.  Avoid drinking large amounts of liquid with your meals.  Avoid eating meals during the 2-3 hours before  bedtime.  Avoid lying down right after you eat.  Do not exercise right after you eat. General Instructions  Pay attention to any changes in your symptoms.  Take over-the-counter and prescription medicines only as told by your doctor. Do not take aspirin, ibuprofen, or other NSAIDs unless your doctor says it is okay.  Do not use any tobacco products, including cigarettes, chewing tobacco, and e-cigarettes. If you need help quitting, ask your doctor.  Wear loose clothes. Do not wear anything tight around your waist.  Raise (elevate) the head of your bed about 6 inches (15 cm).  Try to lower your stress. If you need help doing this, ask your doctor.  If you are overweight, lose an amount of weight that is healthy for you. Ask your doctor about a safe weight loss goal.  Keep all follow-up visits as told by your doctor. This is important. GET HELP IF:  You have new symptoms.  You lose weight and you do not know why it is happening.  You have trouble swallowing, or it hurts to swallow.  You have wheezing or a cough that keeps happening.  Your symptoms do not get better with treatment.  You have heartburn often for more than two weeks. GET HELP RIGHT AWAY IF:  You have pain in your arms, neck, jaw, teeth, or back.  You feel sweaty, dizzy, or light-headed.  You have chest pain or shortness of breath.  You throw up (vomit) and your throw up looks like blood or coffee grounds.  Your poop (stool) is bloody or black.   This information is not intended to replace advice given to you by your health care provider. Make sure you discuss any questions you have with your health care provider.   Document Released: 06/19/2011 Document Revised: 06/28/2015 Document Reviewed: 02/01/2015 Elsevier Interactive Patient Education 2016 [employer].  Otitis Media, Adult Otitis media is redness, soreness, and inflammation of the middle ear. Otitis media may be caused by allergies or, most  commonly, by infection. Often it occurs as a complication of the common cold. SIGNS AND SYMPTOMS Symptoms of otitis media may include:  Earache.  Fever.  Ringing in your  ear.  Headache.  Leakage of fluid from the ear. DIAGNOSIS To diagnose otitis media, your health care provider will examine your ear with an otoscope. This is an instrument that allows your health care provider to see into your ear in order to examine your eardrum. Your health care provider also will ask you questions about your symptoms. TREATMENT  Typically, otitis media resolves on its own within 3-5 days. Your health care provider may prescribe medicine to ease your symptoms of pain. If otitis media does not resolve within 5 days or is recurrent, your health care provider may prescribe antibiotic medicines if he or she suspects that a bacterial infection is the cause. HOME CARE INSTRUCTIONS   If you were prescribed an antibiotic medicine, finish it all even if you start to feel better.  Take medicines only as directed by your health care provider.  Keep all follow-up visits as directed by your health care provider. SEEK MEDICAL CARE IF:  You have otitis media only in one ear, or bleeding from your nose, or both.  You notice a lump on your neck.  You are not getting better in 3-5 days.  You feel worse instead of better. SEEK IMMEDIATE MEDICAL CARE IF:   You have pain that is not controlled with medicine.  You have swelling, redness, or pain around your ear or stiffness in your neck.  You notice that part of your face is paralyzed.  You notice that the bone behind your ear (mastoid) is tender when you touch it. MAKE SURE YOU:   Understand these instructions.  Will watch your condition.  Will get help right away if you are not doing well or get worse.   This information is not intended to replace advice given to you by your health care provider. Make sure you discuss any questions you have with your  health care provider.   Document Released: 07/12/2004 Document Revised: 10/28/2014 Document Reviewed: 05/04/2013 Elsevier Interactive Patient Education 2016 [employer].  Vertigo Vertigo means you feel like you or your surroundings are moving when they are not. Vertigo can be dangerous if it occurs when you are at work, driving, or performing difficult activities.  CAUSES  Vertigo occurs when there is a conflict of signals sent to your brain from the visual and sensory systems in your body. There are many different causes of vertigo, including:  Infections, especially in the inner ear.  A bad reaction to a drug or misuse of alcohol and medicines.  Withdrawal from drugs or alcohol.  Rapidly changing positions, such as lying down or rolling over in bed.  A migraine headache.  Decreased blood flow to the brain.  Increased pressure in the brain from a head injury, infection, tumor, or bleeding. SYMPTOMS  You may feel as though the world is spinning around or you are falling to the ground. Because your balance is upset, vertigo can cause nausea and vomiting. You may have involuntary eye movements (nystagmus). DIAGNOSIS  Vertigo is usually diagnosed by physical exam. If the cause of your vertigo is unknown, your caregiver may perform imaging tests, such as an MRI scan (magnetic resonance imaging). TREATMENT  Most cases of vertigo resolve on their own, without treatment. Depending on the cause, your caregiver may prescribe certain medicines. If your vertigo is related to body position issues, your caregiver may recommend movements or procedures to correct the problem. In rare cases, if your vertigo is caused by certain inner ear problems, you may need surgery. HOME CARE  INSTRUCTIONS   Follow your caregiver's instructions.  Avoid driving.  Avoid operating heavy machinery.  Avoid performing any tasks that would be dangerous to you or others during a vertigo episode.  Tell your  caregiver if you notice that certain medicines seem to be causing your vertigo. Some of the medicines used to treat vertigo episodes can actually make them worse in some people. SEEK IMMEDIATE MEDICAL CARE IF:   Your medicines do not relieve your vertigo or are making it worse.  You develop problems with talking, walking, weakness, or using your arms, hands, or legs.  You develop severe headaches.  Your nausea or vomiting continues or gets worse.  You develop visual changes.  A family member notices behavioral changes.  Your condition gets worse. MAKE SURE YOU:  Understand these instructions.  Will watch your condition.  Will get help right away if you are not doing well or get worse.   This information is not intended to replace advice given to you by your health care provider. Make sure you discuss any questions you have with your health care provider.   Document Released: 07/17/2005 Document Revised: 12/30/2011 Document Reviewed: 01/30/2015 Elsevier Interactive Patient Education [employer]

## 2015-09-06 NOTE — ED Notes (Signed)
Pt states that he has not been taking medications for his asthma and has not been to his PCP.

## 2015-09-06 NOTE — ED Notes (Signed)
Pt st's he has hx of asthma and started having problems breathing 3 days ago.  Pt st's he has been out of his inhaler for approx 4 months.  Pt speaking in full sentences

## 2016-04-22 ENCOUNTER — Emergency Department (HOSPITAL_COMMUNITY)
Admission: EM | Admit: 2016-04-22 | Discharge: 2016-04-22 | Disposition: A | Discharge status: Home / Self Care | Payer: Self-pay | Attending: Emergency Medicine | Admitting: Emergency Medicine

## 2016-04-22 ENCOUNTER — Encounter (HOSPITAL_COMMUNITY): Payer: Self-pay

## 2016-04-22 DIAGNOSIS — J45909 Unspecified asthma, uncomplicated: Secondary | ICD-10-CM

## 2016-04-22 DIAGNOSIS — F17210 Nicotine dependence, cigarettes, uncomplicated: Secondary | ICD-10-CM | POA: Insufficient documentation

## 2016-04-22 DIAGNOSIS — J4520 Mild intermittent asthma, uncomplicated: Secondary | ICD-10-CM | POA: Insufficient documentation

## 2016-04-22 MED ORDER — ALBUTEROL SULFATE HFA 108 (90 BASE) MCG/ACT IN AERS
2.0000 | INHALATION_SPRAY | RESPIRATORY_TRACT | Status: DC | PRN
  Administered 2016-04-22: 2 via RESPIRATORY_TRACT
  Filled 2016-04-22: qty 6.7

## 2016-04-22 MED ORDER — IPRATROPIUM-ALBUTEROL 0.5-2.5 (3) MG/3ML IN SOLN
3.0000 mL | Freq: Once | RESPIRATORY_TRACT | Status: AC
  Administered 2016-04-22: 3 mL via RESPIRATORY_TRACT
  Filled 2016-04-22: qty 3

## 2016-04-22 MED ORDER — PREDNISONE 10 MG PO TABS: 20.0000 mg | ORAL_TABLET | Freq: Two times a day (BID) | ORAL | Status: DC

## 2016-04-22 NOTE — ED Provider Notes (Signed)
History  By signing my name below, I, Earmon PhoenixJennifer Waddell, attest that this documentation has been prepared under the direction and in the presence of [facility] Neese, [place]. Electronically Signed: Earmon PhoenixJennifer Waddell, ED Scribe. 04/22/2016. 9:24 PM.  Chief Complaint  Patient presents with  . Asthma   The history is provided by the patient and medical records. No language interpreter was used.    HPI Comments:  Inez Atha StarksWelborn is a 25 y.o. male with PMHx of asthma who presents to the Emergency Department complaining of SOB with exertion that began about two days ago. He reports some right ear pain and productive cough of green mucous. Pt states he last had issues with his asthma about 5 months ago and was evaluated but no longer has an MDI. He has not done anything for treatment of his symptoms. Walking and moving around increase his SOB. He denies alleviating factors. He denies sore throat, wheezing, cough, fever or chills.   Past Medical History  Diagnosis Date  . Asthma    History reviewed. No pertinent past surgical history. History reviewed. No pertinent family history. Social History  Substance Use Topics  . Smoking status: Current Some Day Smoker    Types: Cigarettes  . Smokeless tobacco: None  . Alcohol Use: No    Review of Systems  HENT: Positive for ear pain.   Respiratory: Positive for cough and shortness of breath. Negative for wheezing.   All other systems reviewed and are negative.   Allergies  Review of patient's allergies indicates no known allergies.  Home Medications   Prior to Admission medications   Medication Sig Start Date End Date Taking? Authorizing Provider  albuterol (PROVENTIL HFA;VENTOLIN HFA) 108 (90 BASE) MCG/ACT inhaler Inhale 2 puffs into the lungs every 6 (six) hours as needed for wheezing or shortness of breath.    Historical Provider, MD  amoxicillin (AMOXIL) 500 MG capsule Take 1 capsule (500 mg total) by mouth 3 (three) times daily. 09/06/15    Danelle BerryLeisa Tapia, PA-C  doxycycline (VIBRAMYCIN) 100 MG capsule Take 1 capsule (100 mg total) by mouth 2 (two) times daily. Patient not taking: Reported on 09/06/2015 04/04/15   Teressa LowerVrinda Pickering, NP  meclizine (ANTIVERT) 12.5 MG tablet Take 1 tablet (12.5 mg total) by mouth 3 (three) times daily as needed for dizziness. 09/06/15   Danelle BerryLeisa Tapia, PA-C  omeprazole (PRILOSEC) 20 MG capsule Take 1 capsule (20 mg total) by mouth daily. 09/06/15   Danelle BerryLeisa Tapia, PA-C  predniSONE (DELTASONE) 10 MG tablet Take 2 tablets (20 mg total) by mouth 2 (two) times daily with a meal. 04/22/16   Hope Orlene OchM Neese, NP   Triage Vitals: BP 110/73 mmHg  Pulse 73  Temp(Src) 98.3 F (36.8 C) (Oral)  Resp 16  Ht 6\' 1"  (1.854 m)  Wt 195 lb (88.451 kg)  BMI 25.73 kg/m2  SpO2 98% Physical Exam  Constitutional: He is oriented to person, place, and time. He appears well-developed and well-nourished. No distress.  HENT:  Head: Normocephalic.  Right Ear: Tympanic membrane normal.  Left Ear: Tympanic membrane normal.  Nose: Nose normal.  Mouth/Throat: Uvula is midline, oropharynx is clear and moist and mucous membranes are normal.  Eyes: EOM are normal.  Neck: Normal range of motion. Neck supple.  Cardiovascular: Normal rate and regular rhythm.   Pulmonary/Chest: Effort normal. No respiratory distress. He has decreased breath sounds. He has no rales.  Decreased breath sounds.   Abdominal: Soft. There is no tenderness.  Musculoskeletal: Normal range of motion.  Neurological: He is alert and oriented to person, place, and time. No cranial nerve deficit.  Skin: Skin is warm and dry.  Psychiatric: He has a normal mood and affect. His behavior is normal.  Nursing note and vitals reviewed.   ED Course  Procedures (including critical care time) DIAGNOSTIC STUDIES: Oxygen Saturation is 98% on RA, normal by my interpretation.   COORDINATION OF CARE: 8:11 PM- Will order breathing treatment and reassess pt. Pt verbalizes  understanding and agrees to plan.  9:23 PM- Improved air movement after nebulizer treatment and pt verbalizes that he feels his breathing is better.  Medications  ipratropium-albuterol (DUONEB) 0.5-2.5 (3) MG/3ML nebulizer solution 3 mL (3 mLs Nebulization Given 04/22/16 2024)     MDM   Final diagnoses:  Asthma, mild   Patient with mild signs and symptoms of asthma/RAD. Oxygen saturation is above 100% on room air. No accessory muscle use, no cyanosis. Treated in the ED.  Patient feels improved after treatment. Will discharge with albuterol inhaler and short course of steroids. Pt instructed to follow up with PCP. Patient is hemodynamically stable. Discussed return precautions. Pt appears safe for discharge.     I personally performed the services described in this documentation, which was scribed in my presence. The recorded information has been reviewed and is accurate.    [address]Hope Lake ForestM Neese, [place] 04/23/16 [id number]  Marily MemosJason Mesner, MD 04/24/16 1640

## 2016-04-22 NOTE — ED Notes (Signed)
Patient verbalized understanding of discharge instructions and denies any further needs or questions at this time. VS stable. Patient ambulatory with steady gait.  

## 2016-04-22 NOTE — ED Notes (Signed)
Pt complaining of asthma since 2 days ago. States no inhaler at home. No obvious wheezing noted in triage.

## 2016-04-22 NOTE — Discharge Instructions (Signed)
Asthma Attack Prevention While you may not be able to control the fact that you have asthma, you can take actions to prevent asthma attacks. The best way to prevent asthma attacks is to maintain good control of your asthma. You can achieve this by:  Taking your medicines as directed.  Avoiding things that can irritate your airways or make your asthma symptoms worse (asthma triggers).  Keeping track of how well your asthma is controlled and of any changes in your symptoms.  Responding quickly to worsening asthma symptoms (asthma attack).  Seeking emergency care when it is needed. WHAT ARE SOME WAYS TO PREVENT AN ASTHMA ATTACK? Have a Plan Work with your health care provider to create a written plan for managing and treating your asthma attacks (asthma action plan). This plan includes:  A list of your asthma triggers and how you can avoid them.  Information on when medicines should be taken and when their dosages should be changed.  The use of a device that measures how well your lungs are working (peak flow meter). Monitor Your Asthma Use your peak flow meter and record your results in a journal every day. A drop in your peak flow numbers on one or more days may indicate the start of an asthma attack. This can happen even before you start to feel symptoms. You can prevent an asthma attack from getting worse by following the steps in your asthma action plan. Avoid Asthma Triggers Work with your asthma health care provider to find out what your asthma triggers are. This can be done by:  Allergy testing.  Keeping a journal that notes when asthma attacks occur and the factors that may have contributed to them.  Determining if there are other medical conditions that are making your asthma worse. Once you have determined your asthma triggers, take steps to avoid them. This may include avoiding excessive or prolonged exposure to:  Dust. Have someone dust and vacuum your home for you once or  twice a week. Using a high-efficiency particulate arrestance (HEPA) vacuum is best.  Smoke. This includes campfire smoke, forest fire smoke, and secondhand smoke from tobacco products.  Pet dander. Avoid contact with animals that you know you are allergic to.  Allergens from trees, grasses or pollens. Avoid spending a lot of time outdoors when pollen counts are high, and on very windy days.  Very cold, dry, or humid air.  Mold.  Foods that contain high amounts of sulfites.  Strong odors.  Outdoor air pollutants, such as engine exhaust.  Indoor air pollutants, such as aerosol sprays and fumes from household cleaners.  Household pests, including dust mites and cockroaches, and pest droppings.  Certain medicines, including NSAIDs. Always talk to your health care provider before stopping or starting any new medicines. Medicines Take over-the-counter and prescription medicines only as told by your health care provider. Many asthma attacks can be prevented by carefully following your medicine schedule. Taking your medicines correctly is especially important when you cannot avoid certain asthma triggers. Act Quickly If an asthma attack does happen, acting quickly can decrease how severe it is and how long it lasts. Take these steps:   Pay attention to your symptoms. If you are coughing, wheezing, or having difficulty breathing, do not wait to see if your symptoms go away on their own. Follow your asthma action plan.  If you have followed your asthma action plan and your symptoms are not improving, call your health care provider or seek immediate medical care   at the nearest hospital. It is important to note how often you need to use your fast-acting rescue inhaler. If you are using your rescue inhaler more often, it may mean that your asthma is not under control. Adjusting your asthma treatment plan may help you to prevent future asthma attacks and help you to gain better control of your  condition. HOW CAN I PREVENT AN ASTHMA ATTACK WHEN I EXERCISE? Follow advice from your health care provider about whether you should use your fast-acting inhaler before exercising. Many people with asthma experience exercise-induced bronchoconstriction (EIB). This condition often worsens during vigorous exercise in cold, humid, or dry environments. Usually, people with EIB can stay very active by pre-treating with a fast-acting inhaler before exercising.   This information is not intended to replace advice given to you by your health care provider. Make sure you discuss any questions you have with your health care provider.   Document Released: 09/25/2009 Document Revised: 06/28/2015 Document Reviewed: 03/09/2015 Elsevier Interactive Patient Education 2016 [employer]  

## 2016-04-22 NOTE — ED Notes (Signed)
NP to see and assess patient before RN assessment. See NP note. 

## 2016-05-20 ENCOUNTER — Encounter (HOSPITAL_COMMUNITY): Payer: Self-pay | Admitting: *Deleted

## 2016-05-20 DIAGNOSIS — Z5321 Procedure and treatment not carried out due to patient leaving prior to being seen by health care provider: Secondary | ICD-10-CM | POA: Insufficient documentation

## 2016-05-20 DIAGNOSIS — J45909 Unspecified asthma, uncomplicated: Secondary | ICD-10-CM | POA: Insufficient documentation

## 2016-05-20 DIAGNOSIS — F17210 Nicotine dependence, cigarettes, uncomplicated: Secondary | ICD-10-CM | POA: Insufficient documentation

## 2016-05-20 NOTE — ED Triage Notes (Signed)
The pt is c/o having a problem with asthma at night when he sleeps   For 2 nights.  No audible wheezes no resp difficulty

## 2016-05-21 ENCOUNTER — Emergency Department (HOSPITAL_COMMUNITY)
Admission: EM | Admit: 2016-05-21 | Discharge: 2016-05-21 | Disposition: A | Discharge status: Home / Self Care | Payer: Self-pay | Attending: Emergency Medicine | Admitting: Emergency Medicine

## 2016-05-21 NOTE — ED Provider Notes (Signed)
[facility] after triage   Marlon Pel, PA-C 05/21/16 0200    Shon Baton, MD 05/21/16 2251

## 2020-03-08 ENCOUNTER — Other Ambulatory Visit: Payer: Self-pay

## 2020-03-08 ENCOUNTER — Encounter (HOSPITAL_COMMUNITY): Payer: Self-pay | Admitting: Emergency Medicine

## 2020-03-08 ENCOUNTER — Emergency Department (HOSPITAL_COMMUNITY)
Admission: EM | Admit: 2020-03-08 | Discharge: 2020-03-08 | Disposition: A | Discharge status: Home / Self Care | Payer: Self-pay | Attending: Emergency Medicine | Admitting: Emergency Medicine

## 2020-03-08 DIAGNOSIS — H65192 Other acute nonsuppurative otitis media, left ear: Secondary | ICD-10-CM | POA: Insufficient documentation

## 2020-03-08 DIAGNOSIS — J45909 Unspecified asthma, uncomplicated: Secondary | ICD-10-CM | POA: Insufficient documentation

## 2020-03-08 DIAGNOSIS — F17210 Nicotine dependence, cigarettes, uncomplicated: Secondary | ICD-10-CM | POA: Insufficient documentation

## 2020-03-08 MED ORDER — FLUTICASONE PROPIONATE 50 MCG/ACT NA SUSP: 2.0000 | Freq: Every day | NASAL | 0 refills | Status: DC

## 2020-03-08 MED ORDER — LIDOCAINE HCL 2 % IJ SOLN
5.0000 mL | Freq: Once | INTRAMUSCULAR | Status: AC
  Administered 2020-03-08: 100 mg
  Filled 2020-03-08: qty 20

## 2020-03-08 NOTE — Discharge Instructions (Addendum)
Try Flonase daily.  You have fluid behind the left ear causing the fullness sensation in the ear.  I do not see any foreign body or any bugs in your ear canal.  Follow-up with your doctor  Return to ER if you have worse ear pain or loss of hearing or purulent discharge or fevers.

## 2020-03-08 NOTE — ED Provider Notes (Signed)
[name] Note   CSN: [id number] Arrival date & time: 03/08/20  1847     History Chief Complaint  Patient presents with  . Ear Fullness    left    Daniel Keith is a 29 y.o. male history_here presenting with left ear fullness.  He felt that the left ear feels full since yesterday .  He tried to use a Q-tip but unable to get it.  Denies any bugs crawling.  Denies any sinus congestion or fevers.  Denies any purulent discharge from the ear.  The history is provided by the patient.       Past Medical History:  Diagnosis Date  . Asthma     There are no problems to display for this patient.   History reviewed. No pertinent surgical history.     No family history on file.  Social History   Tobacco Use  . Smoking status: Current Some Day Smoker    Types: Cigarettes  . Smokeless tobacco: Never Used  Substance Use Topics  . Alcohol use: No  . Drug use: No    Home Medications Prior to Admission medications   Medication Sig Start Date End Date Taking? Authorizing Provider  albuterol (PROVENTIL HFA;VENTOLIN HFA) 108 (90 BASE) MCG/ACT inhaler Inhale 2 puffs into the lungs every 6 (six) hours as needed for wheezing or shortness of breath.    [provider]  amoxicillin (AMOXIL) 500 MG capsule Take 1 capsule (500 mg total) by mouth 3 (three) times daily. 09/06/15   Delsa Grana, PA-C  doxycycline (VIBRAMYCIN) 100 MG capsule Take 1 capsule (100 mg total) by mouth 2 (two) times daily. Patient not taking: Reported on 09/06/2015 04/04/15   [name], NP  meclizine (ANTIVERT) 12.5 MG tablet Take 1 tablet (12.5 mg total) by mouth 3 (three) times daily as needed for dizziness. 09/06/15   Delsa Grana, PA-C  omeprazole (PRILOSEC) 20 MG capsule Take 1 capsule (20 mg total) by mouth daily. 09/06/15   Delsa Grana, PA-C  predniSONE (DELTASONE) 10 MG tablet Take 2 tablets (20 mg total) by mouth 2 (two) times daily with a  meal. 04/22/16   [name], NP    Allergies    Patient has no known allergies.  Review of Systems   Review of Systems  HENT: Positive for ear pain.   All other systems reviewed and are negative.   Physical Exam Updated Vital Signs BP 129/82   Pulse 100   Temp 99.9 F (37.7 C) (Oral)   Resp 16   SpO2 100%   Physical Exam Vitals and nursing note reviewed.  Constitutional:      Appearance: Normal appearance.  HENT:     Head: Normocephalic.     Ears:     Comments: Effusion behind L TM but TM is not red. No obvious foreign body in the ear canal. R TM and ear canal nl     Nose: Nose normal.     Mouth/Throat:     Mouth: Mucous membranes are moist.  Eyes:     Extraocular Movements: Extraocular movements intact.     Pupils: Pupils are equal, round, and reactive to light.  Cardiovascular:     Rate and Rhythm: Normal rate and regular rhythm.     Pulses: Normal pulses.     Heart sounds: Normal heart sounds.  Pulmonary:     Effort: Pulmonary effort is normal.     Breath sounds: Normal breath sounds.  Abdominal:  General: Abdomen is flat.     Palpations: Abdomen is soft.  Musculoskeletal:        General: Normal range of motion.     Cervical back: Normal range of motion.  Skin:    General: Skin is warm.     Capillary Refill: Capillary refill takes less than 2 seconds.  Neurological:     General: No focal deficit present.     Mental Status: He is alert and oriented to person, place, and time.  Psychiatric:        Mood and Affect: Mood normal.        Behavior: Behavior normal.     ED Results / Procedures / Treatments   Labs (all labs ordered are listed, but only abnormal results are displayed) Labs Reviewed - No data to display  EKG None  Radiology No results found.  Procedures Procedures (including critical care time)  Medications Ordered in ED Medications  lidocaine (XYLOCAINE) 2 % (with pres) injection 100 mg (100 mg Other Given by Other 03/08/20  1942)    ED Course  I have reviewed the triage vital signs and the nursing notes.  Pertinent labs & imaging results that were available during my care of the patient were reviewed by me and considered in my medical decision making (see chart for details).    MDM Rules/Calculators/A&P                      [name] is a 29 y.o. male who presented with left ear pain.  He does have some effusion behind the left ear .  I was unable to visualize any bug in the ear canal.  I put some lidocaine in the ear and really examined the ear and there is still no signs of foreign body or insect.  I think he likely has ear effusion causing symptoms. No signs of otitis media. Will dc home with flonase.   Final Clinical Impression(s) / ED Diagnoses Final diagnoses:  None    Rx / DC Orders ED Discharge Orders    None       [name], MD 03/08/20 2008

## 2020-03-08 NOTE — ED Triage Notes (Signed)
Pt believes has bug or something in left ear. Reports feels something and when stick q-tip in there can only go so far. Denies pain.

## 2020-03-08 NOTE — ED Notes (Signed)
Pt verbalizes understanding of DC instructions. Pt belongings returned and is ambulatory out of ED.   Signature pad not available at this time  

## 2020-03-19 ENCOUNTER — Encounter (HOSPITAL_COMMUNITY): Payer: Self-pay | Admitting: Emergency Medicine

## 2020-03-19 ENCOUNTER — Other Ambulatory Visit: Payer: Self-pay

## 2020-03-19 ENCOUNTER — Emergency Department (HOSPITAL_COMMUNITY)
Admission: EM | Admit: 2020-03-19 | Discharge: 2020-03-19 | Disposition: A | Discharge status: Home / Self Care | Payer: Self-pay | Attending: Emergency Medicine | Admitting: Emergency Medicine

## 2020-03-19 DIAGNOSIS — R519 Headache, unspecified: Secondary | ICD-10-CM | POA: Insufficient documentation

## 2020-03-19 DIAGNOSIS — T7840XA Allergy, unspecified, initial encounter: Secondary | ICD-10-CM | POA: Insufficient documentation

## 2020-03-19 DIAGNOSIS — F17210 Nicotine dependence, cigarettes, uncomplicated: Secondary | ICD-10-CM | POA: Insufficient documentation

## 2020-03-19 NOTE — ED Triage Notes (Signed)
Patient here from home reporting that his "hairline is swelling" x2 days.  Unsure of what patient is referring to.

## 2020-03-19 NOTE — ED Provider Notes (Signed)
[facility] DEPT Provider Note   CSN: [id number] Arrival date & time: 03/19/20  1218     History Chief Complaint  Patient presents with  . Facial Swelling    Daniel Keith is a 29 y.o. male.With no pertinent past medical history that presents emergency department today for forehead facial swelling.  Also has associated pain in that area along with right-sided headache, states that it is a 5/10.  Denies any head trauma, vision changes, weakness, photophobia, worse headache of his life, pulsatile headache.  States that headache is constant and not exertional.  Patient states that this has been occurring to him since since he was 29 years old.  States that the swelling and the headache always occur together.  Has never seen anyone about this.  States that he has a lot of anxiety from his family members and their medical problems and therefore wanted to be evaluated.  States that the swelling in his face normally goes down after he takes Benadryl, states that he took 2 Benadryl over the last 3 days and it has resolved slightly. Has been treating this empirically with Benadryl since he was 52 which normally works.  Patient states that swelling is around his forehead denies any swelling around his eyes, face, lips, groin or other regions.  States that he is urinating normally, no hematuria.  Denies any bug bites or tick bites.  Denies any rashes.  Denies any fevers, chills, chest pain, shortness of breath.  Denies any nausea or vomiting.  Denies any rash or itching the scalp.  States that this randomly occurs to him since he was 49, has not identified any food triggers. HPI     Past Medical History:  Diagnosis Date  . Asthma     There are no problems to display for this patient.   History reviewed. No pertinent surgical history.     History reviewed. No pertinent family history.  Social History   Tobacco Use  . Smoking status: Current Some Day Smoker    Types: Cigarettes  . Smokeless tobacco: Never Used  Substance Use Topics  . Alcohol use: No  . Drug use: No    Home Medications Prior to Admission medications   Medication Sig Start Date End Date Taking? Authorizing Provider  albuterol (PROVENTIL HFA;VENTOLIN HFA) 108 (90 BASE) MCG/ACT inhaler Inhale 2 puffs into the lungs every 6 (six) hours as needed for wheezing or shortness of breath.    [provider]  amoxicillin (AMOXIL) 500 MG capsule Take 1 capsule (500 mg total) by mouth 3 (three) times daily. 09/06/15   Delsa Grana, PA-C  doxycycline (VIBRAMYCIN) 100 MG capsule Take 1 capsule (100 mg total) by mouth 2 (two) times daily. Patient not taking: Reported on 09/06/2015 04/04/15   [name], NP  fluticasone [facility]) 50 MCG/ACT nasal spray Place 2 sprays into both nostrils daily. 03/08/20   Drenda Freeze, MD  meclizine (ANTIVERT) 12.5 MG tablet Take 1 tablet (12.5 mg total) by mouth 3 (three) times daily as needed for dizziness. 09/06/15   Delsa Grana, PA-C  omeprazole (PRILOSEC) 20 MG capsule Take 1 capsule (20 mg total) by mouth daily. 09/06/15   Delsa Grana, PA-C  predniSONE (DELTASONE) 10 MG tablet Take 2 tablets (20 mg total) by mouth 2 (two) times daily with a meal. 04/22/16   [name], NP    Allergies    Patient has no known allergies.  Review of Systems   Review of Systems  Constitutional: Negative for diaphoresis, fatigue and fever.  HENT: Positive for facial swelling (Right forehead). Negative for congestion, dental problem, drooling, ear discharge and ear pain.   Eyes: Negative for visual disturbance.  Respiratory: Negative for shortness of breath.   Cardiovascular: Negative for chest pain.  Gastrointestinal: Negative for nausea and vomiting.  Musculoskeletal: Negative for back pain and myalgias.  Skin: Negative for color change, pallor, rash and wound.  Neurological: Negative for syncope, weakness, light-headedness, numbness and headaches.    Psychiatric/Behavioral: Negative for behavioral problems and confusion.    Physical Exam Updated Vital Signs BP 137/83 (BP Location: Right Arm)   Pulse 70   Temp 97.9 F (36.6 C) (Oral)   Resp 18   Ht 6\' 1"  (1.854 m)   Wt 93 kg   SpO2 100%   BMI 27.05 kg/m   Physical Exam Constitutional:      General: He is not in acute distress.    Appearance: Normal appearance. He is not ill-appearing, toxic-appearing or diaphoretic.  HENT:     Head: Normocephalic and atraumatic.      Comments: Uvula is midline without any uvula swelling.  Patient is handling secretions.    Nose: Nose normal. No congestion.     Mouth/Throat:     Mouth: Mucous membranes are moist.     Pharynx: No oropharyngeal exudate or posterior oropharyngeal erythema.  Eyes:     Extraocular Movements: Extraocular movements intact.     Pupils: Pupils are equal, round, and reactive to light.  Cardiovascular:     Rate and Rhythm: Normal rate and regular rhythm.     Pulses: Normal pulses.     Heart sounds: No murmur.  Pulmonary:     Effort: Pulmonary effort is normal. No respiratory distress.     Breath sounds: Normal breath sounds. No stridor. No wheezing, rhonchi or rales.  Chest:     Chest wall: No tenderness.  Musculoskeletal:        General: No swelling or tenderness. Normal range of motion.     Cervical back: Neck supple. No rigidity.  Lymphadenopathy:     Cervical: No cervical adenopathy.  Skin:    General: Skin is warm and dry.     Capillary Refill: Capillary refill takes less than 2 seconds.  Neurological:     General: No focal deficit present.     Mental Status: He is alert and oriented to person, place, and time.  Psychiatric:        Mood and Affect: Mood normal.        Behavior: Behavior normal.        Thought Content: Thought content normal.     ED Results / Procedures / Treatments   Labs (all labs ordered are listed, but only abnormal results are displayed) Labs Reviewed - No data to  display  EKG None  Radiology No results found.  Procedures Procedures (including critical care time)  Medications Ordered in ED Medications - No data to display  ED Course  I have reviewed the triage vital signs and the nursing notes.  Pertinent labs & imaging results that were available during my care of the patient were reviewed by me and considered in my medical decision making (see chart for details).    MDM Rules/Calculators/A&P                     Daniel Keith is a 29 y.o. male.With no pertinent past medical history that presents emergency department today for forehead  facial swelling.  Patient without any swelling on exam.  Patient without any airway compromise.  Patient without any rashes or edema elsewhere.  Patient without any other complaints.  Not in any respiratory distress.  This has been occurring since he was 17 and I do not think this is an emergent cause and needs a work-up.  Patient has been taking Benadryl which provides relief and reduces swelling.  I offered patient migraine cocktail, states that he does not want this at this time.  Patient states that his headache is very mild.  I asked him why he was here, states that he wants to figure out why he has forehead swelling, and I stated that we will not be able to figure this out today however I will refer you to allergy clinic.  Patient is agreeable and is happy that I will do this.  Patient does not want any blood work at this time.  Expressed to patient that patient to take Allegra daily until he sees allergist.  Only use Benadryl as breakthrough medications.  Expressed to patient once come back to the ER if he has any airway compromise, difficulty breathing, swelling around his lips or facial regions, worsening symptoms.  Patient to be discharged.  Doubt need for further emergent work up at this time. I explained the diagnosis and have given explicit precautions to return to the ER including for any other new or  worsening symptoms. The patient understands and accepts the medical plan as it's been dictated and I have answered their questions. Discharge instructions concerning home care and prescriptions have been given. The patient is STABLE and is discharged to home in good condition.  I discussed this case with my attending physician who cosigned this note including patient's presenting symptoms, physical exam, and planned diagnostics and interventions. Attending physician stated agreement with plan or made changes to plan which were implemented.  Final Clinical Impression(s) / ED Diagnoses Final diagnoses:  Allergic reaction, initial encounter    Rx / DC Orders ED Discharge Orders    None       Farrel Gordon, PA-C 03/19/20 1531    Derwood Kaplan, MD 03/20/20 1534

## 2020-03-19 NOTE — Discharge Instructions (Addendum)
You were seen today for forehead swelling.  I think this is due to allergies.  I want you to take Allegra daily as we discussed.  Use Benadryl only when needed.  I want you to make an appointment with [facility].  Come back to the emergency room if you have any worsening symptoms, difficulty breathing, chest tightness, difficulty swallowing, facial swelling involving your lips or throat.  Read the guide attached.

## 2020-03-19 NOTE — ED Notes (Signed)
Pt has discharge orders. Pt left before receiving AVS from RN and before repeat VS could be obtained.

## 2020-08-26 ENCOUNTER — Encounter (HOSPITAL_COMMUNITY): Payer: Self-pay | Admitting: *Deleted

## 2020-08-26 ENCOUNTER — Emergency Department (HOSPITAL_COMMUNITY): Payer: Self-pay

## 2020-08-26 ENCOUNTER — Emergency Department (HOSPITAL_COMMUNITY)
Admission: EM | Admit: 2020-08-26 | Discharge: 2020-08-26 | Disposition: A | Discharge status: Home / Self Care | Payer: Self-pay | Attending: Emergency Medicine | Admitting: Emergency Medicine

## 2020-08-26 ENCOUNTER — Other Ambulatory Visit: Payer: Self-pay

## 2020-08-26 DIAGNOSIS — Y92410 Unspecified street and highway as the place of occurrence of the external cause: Secondary | ICD-10-CM | POA: Insufficient documentation

## 2020-08-26 DIAGNOSIS — J45909 Unspecified asthma, uncomplicated: Secondary | ICD-10-CM | POA: Insufficient documentation

## 2020-08-26 DIAGNOSIS — R935 Abnormal findings on diagnostic imaging of other abdominal regions, including retroperitoneum: Secondary | ICD-10-CM | POA: Insufficient documentation

## 2020-08-26 DIAGNOSIS — S30810A Abrasion of lower back and pelvis, initial encounter: Secondary | ICD-10-CM | POA: Insufficient documentation

## 2020-08-26 DIAGNOSIS — F17210 Nicotine dependence, cigarettes, uncomplicated: Secondary | ICD-10-CM | POA: Insufficient documentation

## 2020-08-26 DIAGNOSIS — T07XXXA Unspecified multiple injuries, initial encounter: Secondary | ICD-10-CM

## 2020-08-26 DIAGNOSIS — R918 Other nonspecific abnormal finding of lung field: Secondary | ICD-10-CM | POA: Insufficient documentation

## 2020-08-26 DIAGNOSIS — S80211A Abrasion, right knee, initial encounter: Secondary | ICD-10-CM | POA: Insufficient documentation

## 2020-08-26 DIAGNOSIS — S60511A Abrasion of right hand, initial encounter: Secondary | ICD-10-CM | POA: Insufficient documentation

## 2020-08-26 DIAGNOSIS — S90811A Abrasion, right foot, initial encounter: Secondary | ICD-10-CM | POA: Insufficient documentation

## 2020-08-26 DIAGNOSIS — S0081XA Abrasion of other part of head, initial encounter: Secondary | ICD-10-CM | POA: Insufficient documentation

## 2020-08-26 DIAGNOSIS — Y9389 Activity, other specified: Secondary | ICD-10-CM | POA: Insufficient documentation

## 2020-08-26 DIAGNOSIS — S70211A Abrasion, right hip, initial encounter: Secondary | ICD-10-CM | POA: Insufficient documentation

## 2020-08-26 LAB — I-STAT CHEM 8, ED
BUN: 6 mg/dL (ref 6–20)
Calcium, Ion: 1.33 mmol/L (ref 1.15–1.40)
Chloride: 101 mmol/L (ref 98–111)
Creatinine, Ser: 0.9 mg/dL (ref 0.61–1.24)
Glucose, Bld: 103 mg/dL — ABNORMAL HIGH (ref 70–99)
HCT: 46 % (ref 39.0–52.0)
Hemoglobin: 15.6 g/dL (ref 13.0–17.0)
Potassium: 3.7 mmol/L (ref 3.5–5.1)
Sodium: 140 mmol/L (ref 135–145)
TCO2: 29 mmol/L (ref 22–32)

## 2020-08-26 LAB — CBC
HCT: 46.2 % (ref 39.0–52.0)
Hemoglobin: 15.4 g/dL (ref 13.0–17.0)
MCH: 31.7 pg (ref 26.0–34.0)
MCHC: 33.3 g/dL (ref 30.0–36.0)
MCV: 95.1 fL (ref 80.0–100.0)
Platelets: 286 10*3/uL (ref 150–400)
RBC: 4.86 MIL/uL (ref 4.22–5.81)
RDW: 12.4 % (ref 11.5–15.5)
WBC: 12 10*3/uL — ABNORMAL HIGH (ref 4.0–10.5)
nRBC: 0 % (ref 0.0–0.2)

## 2020-08-26 LAB — COMPREHENSIVE METABOLIC PANEL
ALT: 18 U/L (ref 0–44)
AST: 22 U/L (ref 15–41)
Albumin: 4.5 g/dL (ref 3.5–5.0)
Alkaline Phosphatase: 54 U/L (ref 38–126)
Anion gap: 13 (ref 5–15)
BUN: 6 mg/dL (ref 6–20)
CO2: 26 mmol/L (ref 22–32)
Calcium: 10 mg/dL (ref 8.9–10.3)
Chloride: 101 mmol/L (ref 98–111)
Creatinine, Ser: 0.96 mg/dL (ref 0.61–1.24)
GFR, Estimated: >60 mL/min (ref >60)
Glucose, Bld: 105 mg/dL — ABNORMAL HIGH (ref 70–99)
Potassium: 3.8 mmol/L (ref 3.5–5.1)
Sodium: 140 mmol/L (ref 135–145)
Total Bilirubin: 1.1 mg/dL (ref 0.3–1.2)
Total Protein: 7.1 g/dL (ref 6.5–8.1)

## 2020-08-26 MED ORDER — MORPHINE SULFATE (PF) 4 MG/ML IV SOLN
4.0000 mg | Freq: Once | INTRAVENOUS | Status: AC
  Administered 2020-08-26: 4 mg via INTRAVENOUS
  Filled 2020-08-26: qty 1

## 2020-08-26 MED ORDER — SODIUM CHLORIDE 0.9 % IV BOLUS (SEPSIS)
1000.0000 mL | Freq: Once | INTRAVENOUS | Status: AC
Start: 2020-08-26 — End: 2020-08-26
  Administered 2020-08-26: 1000 mL via INTRAVENOUS

## 2020-08-26 MED ORDER — SODIUM CHLORIDE 0.9 % IV SOLN
1000.0000 mL | INTRAVENOUS | Status: DC
  Administered 2020-08-26: 1000 mL via INTRAVENOUS

## 2020-08-26 MED ORDER — IOHEXOL 300 MG/ML  SOLN
100.0000 mL | Freq: Once | INTRAMUSCULAR | Status: AC | PRN
  Administered 2020-08-26: 100 mL via INTRAVENOUS
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
## 2020-08-26 NOTE — ED Notes (Signed)
To ct

## 2020-08-26 NOTE — ED Notes (Signed)
To x-ray
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
## 2020-10-02 ENCOUNTER — Other Ambulatory Visit: Payer: Self-pay

## 2020-10-02 ENCOUNTER — Encounter (HOSPITAL_COMMUNITY): Payer: Self-pay | Admitting: Emergency Medicine

## 2020-10-02 ENCOUNTER — Emergency Department (HOSPITAL_COMMUNITY)
Admission: EM | Admit: 2020-10-02 | Discharge: 2020-10-02 | Disposition: A | Discharge status: Home / Self Care | Payer: Self-pay | Attending: Emergency Medicine | Admitting: Emergency Medicine

## 2020-10-02 ENCOUNTER — Ambulatory Visit: Payer: Self-pay

## 2020-10-02 ENCOUNTER — Emergency Department (HOSPITAL_COMMUNITY): Payer: Self-pay

## 2020-10-02 DIAGNOSIS — R0602 Shortness of breath: Secondary | ICD-10-CM | POA: Insufficient documentation

## 2020-10-02 DIAGNOSIS — R079 Chest pain, unspecified: Secondary | ICD-10-CM | POA: Insufficient documentation

## 2020-10-02 DIAGNOSIS — Z5321 Procedure and treatment not carried out due to patient leaving prior to being seen by health care provider: Secondary | ICD-10-CM | POA: Insufficient documentation

## 2020-10-02 LAB — BASIC METABOLIC PANEL
Anion gap: 12 (ref 5–15)
BUN: 7 mg/dL (ref 6–20)
CO2: 30 mmol/L (ref 22–32)
Calcium: 10 mg/dL (ref 8.9–10.3)
Chloride: 96 mmol/L — ABNORMAL LOW (ref 98–111)
Creatinine, Ser: 1.02 mg/dL (ref 0.61–1.24)
GFR, Estimated: >60 mL/min (ref >60)
Glucose, Bld: 96 mg/dL (ref 70–99)
Potassium: 4.7 mmol/L (ref 3.5–5.1)
Sodium: 138 mmol/L (ref 135–145)

## 2020-10-02 LAB — CBC
HCT: 48.7 % (ref 39.0–52.0)
Hemoglobin: 16.3 g/dL (ref 13.0–17.0)
MCH: 31.5 pg (ref 26.0–34.0)
MCHC: 33.5 g/dL (ref 30.0–36.0)
MCV: 94 fL (ref 80.0–100.0)
Platelets: 259 10*3/uL (ref 150–400)
RBC: 5.18 MIL/uL (ref 4.22–5.81)
RDW: 12.4 % (ref 11.5–15.5)
WBC: 10.3 10*3/uL (ref 4.0–10.5)
nRBC: 0 % (ref 0.0–0.2)

## 2020-10-02 LAB — TROPONIN I (HIGH SENSITIVITY): Troponin I (High Sensitivity): 5 ng/L (ref <18)

## 2020-10-02 NOTE — Telephone Encounter (Signed)
Patient called and says he's been having chest pain for the past couple of days at a 6/10 that gets worse with exertion and deep breathing. He says it's to the left side of his chest and doesn't radiate anywhere else. Denies any other symptoms. He says he has asthma and it feels like it does when the asthma is getting ready to flare up. I advised to go the UC, since no PCP. Care advice given, patient verbalized understanding.   Reason for Disposition . [1] Chest pain(s) lasting a few seconds AND [2] persists > 3 days  Answer Assessment - Initial Assessment Questions 1. LOCATION: "Where does it hurt?"       Left side 2. RADIATION: "Does the pain go anywhere else?" (e.g., into neck, jaw, arms, back)     No, just stays to the left 3. ONSET: "When did the chest pain begin?" (Minutes, hours or days)      Couple of days 4. PATTERN "Does the pain come and go, or has it been constant since it started?"  "Does it get worse with exertion?"      Constant; any activity, walking or taking a deep breath 5. DURATION: "How long does it last" (e.g., seconds, minutes, hours)     Constant 6. SEVERITY: "How bad is the pain?"  (e.g., Scale 1-10; mild, moderate, or severe)    - MILD (1-3): doesn't interfere with normal activities     - MODERATE (4-7): interferes with normal activities or awakens from sleep    - SEVERE (8-10): excruciating pain, unable to do any normal activities       6 7. CARDIAC RISK FACTORS: "Do you have any history of heart problems or risk factors for heart disease?" (e.g., angina, prior heart attack; diabetes, high blood pressure, high cholesterol, smoker, or strong family history of heart disease)     Strong family history 8. PULMONARY RISK FACTORS: "Do you have any history of lung disease?"  (e.g., blood clots in lung, asthma, emphysema, birth control pills)     Asthma 9. CAUSE: "What do you think is causing the chest pain?"     I don't know 10. OTHER SYMPTOMS: "Do you have any other  symptoms?" (e.g., dizziness, nausea, vomiting, sweating, fever, difficulty breathing, cough)     Difficulty breathing, mild case 11. PREGNANCY: "Is there any chance you are pregnant?" "When was your last menstrual period?"      N/A  Protocols used: CHEST PAIN-A-AH

## 2020-10-02 NOTE — ED Triage Notes (Signed)
Patient reports left chest pain with SOB onset this week , denies cough or fever , no emesis or diaphoresis .

## 2020-10-02 NOTE — ED Notes (Signed)
Called pt multiple times for vitals recheck as well as for registration and no answer

## 2021-01-11 ENCOUNTER — Other Ambulatory Visit: Payer: Self-pay

## 2021-01-11 ENCOUNTER — Encounter (HOSPITAL_COMMUNITY): Payer: Self-pay

## 2021-01-11 ENCOUNTER — Emergency Department (HOSPITAL_COMMUNITY)
Admission: EM | Admit: 2021-01-11 | Discharge: 2021-01-12 | Disposition: A | Discharge status: Home / Self Care | Payer: Self-pay | Attending: Emergency Medicine | Admitting: Emergency Medicine

## 2021-01-11 DIAGNOSIS — W1831XA Fall on same level due to stepping on an object, initial encounter: Secondary | ICD-10-CM | POA: Insufficient documentation

## 2021-01-11 DIAGNOSIS — R2241 Localized swelling, mass and lump, right lower limb: Secondary | ICD-10-CM | POA: Insufficient documentation

## 2021-01-11 DIAGNOSIS — M79671 Pain in right foot: Secondary | ICD-10-CM | POA: Insufficient documentation

## 2021-01-11 DIAGNOSIS — Z5321 Procedure and treatment not carried out due to patient leaving prior to being seen by health care provider: Secondary | ICD-10-CM | POA: Insufficient documentation

## 2021-01-11 NOTE — ED Notes (Signed)
Patient no answer for room, unable to locate patient inside/outside lobby.

## 2021-01-11 NOTE — ED Triage Notes (Addendum)
Right foot pain since yesterday after stepping on unknown object at his yard. Pt reports pain and slight swelling to foot.  Updated on tetanus. No foreign body noted on foot.
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
